# Patient Record
Sex: Female | Born: 1950 | Race: White | Hispanic: No | Smoking: Never smoker
Health system: Southern US, Community
[De-identification: ages and names within clinical notes are randomized; demographics above are authoritative.]

## PROBLEM LIST (undated history)

## (undated) DIAGNOSIS — I10 Essential (primary) hypertension: Secondary | ICD-10-CM

## (undated) DIAGNOSIS — I493 Ventricular premature depolarization: Secondary | ICD-10-CM

## (undated) DIAGNOSIS — I491 Atrial premature depolarization: Principal | ICD-10-CM

## (undated) DIAGNOSIS — R000 Tachycardia, unspecified: Secondary | ICD-10-CM

## (undated) DIAGNOSIS — I471 Supraventricular tachycardia: Secondary | ICD-10-CM

## (undated) HISTORY — DX: Supraventricular tachycardia: I47.1

## (undated) HISTORY — DX: Essential (primary) hypertension: I10

## (undated) HISTORY — DX: Atrial premature depolarization: I49.1

## (undated) HISTORY — PX: BREAST BIOPSY: SHX20

## (undated) HISTORY — DX: Ventricular premature depolarization: I49.3

---

## 1998-05-04 ENCOUNTER — Other Ambulatory Visit: Admission: RE | Admit: 1998-05-04 | Discharge: 1998-05-04 | Payer: Self-pay | Admitting: Obstetrics and Gynecology

## 1998-10-24 ENCOUNTER — Emergency Department (HOSPITAL_COMMUNITY): Admission: EM | Admit: 1998-10-24 | Discharge: 1998-10-25 | Payer: Self-pay | Admitting: Emergency Medicine

## 2001-02-16 ENCOUNTER — Ambulatory Visit (HOSPITAL_COMMUNITY): Admission: RE | Admit: 2001-02-16 | Discharge: 2001-02-16 | Payer: Self-pay | Admitting: Gastroenterology

## 2004-12-07 ENCOUNTER — Encounter: Admission: RE | Admit: 2004-12-07 | Discharge: 2005-03-07 | Payer: Self-pay | Admitting: Orthopedic Surgery

## 2005-03-17 ENCOUNTER — Emergency Department (HOSPITAL_COMMUNITY): Admission: EM | Admit: 2005-03-17 | Discharge: 2005-03-17 | Payer: Self-pay | Admitting: Emergency Medicine

## 2006-03-14 ENCOUNTER — Other Ambulatory Visit: Admission: RE | Admit: 2006-03-14 | Discharge: 2006-03-14 | Payer: Self-pay | Admitting: Family Medicine

## 2007-10-08 ENCOUNTER — Other Ambulatory Visit: Admission: RE | Admit: 2007-10-08 | Discharge: 2007-10-08 | Payer: Self-pay | Admitting: Family Medicine

## 2010-02-05 ENCOUNTER — Other Ambulatory Visit: Admission: RE | Admit: 2010-02-05 | Discharge: 2010-02-05 | Payer: Self-pay | Admitting: Family Medicine

## 2010-05-21 ENCOUNTER — Encounter: Payer: Self-pay | Admitting: Family Medicine

## 2010-09-14 NOTE — Procedures (Signed)
[place]. [facility]  Patient:    Kelsey Wagner, Kelsey Wagner Visit Number: [id number] MRN: [id number]          Service Type: END Location: ENDO Attending Physician:  Rich Brave Dictated by:   Florencia Reasons, M.D. Proc. Date: 02/16/01 Admit Date:  02/16/2001   CC:         Dyanne Carrel, M.D.                           Procedure Report  PROCEDURE PERFORMED:  Colonoscopy.  ENDOSCOPIST:  Florencia Reasons, M.D.  INDICATIONS FOR PROCEDURE:  Screening for colon cancer in an asymptomatic 60 year old female (does have sporadic urgency with defecation consistent with irritable bowel syndrome, and also a history of colon cancer in some distant relatives and colon polyps in her mother).  FINDINGS:  Normal exam to the cecum.  DESCRIPTION OF PROCEDURE:  The nature, purpose and risks of the procedure had been discussed with the patient, who provided written consent.  Sedation was fentanyl 70 mcg and Versed 7 mg IV without arrhythmias or desaturation.  The Olympus colonoscope was advanced to the cecum as identified by visualization of the appendiceal orifice and pull-back was then performed.  The quality of the prep was excellent and it is felt that all areas were well seen.  This was a normal examination.  No polyps, cancer, colitis, vascular malformations or diverticular disease were observed.  Retroflexion in the rectum showed hypertrophied anal papilla but no other abnormalities.  No biopsies were obtained.  The patient tolerated the procedure well and there were no apparent complications.  IMPRESSION:  Normal colonoscopy.  PLAN:  Consider follow-up screening exam in five years. Dictated by:   Florencia Reasons, M.D. Attending Physician:  Rich Brave DD:  02/16/01 TD:  02/17/01 Job: [id number]

## 2010-11-05 ENCOUNTER — Other Ambulatory Visit: Payer: Self-pay | Admitting: Otolaryngology

## 2010-12-24 ENCOUNTER — Other Ambulatory Visit: Payer: Self-pay | Admitting: Gastroenterology

## 2011-02-07 ENCOUNTER — Other Ambulatory Visit: Payer: Self-pay | Admitting: Family Medicine

## 2011-02-14 ENCOUNTER — Ambulatory Visit
Admission: RE | Admit: 2011-02-14 | Discharge: 2011-02-14 | Disposition: A | Discharge status: Home / Self Care | Payer: BC Managed Care – PPO | Source: Ambulatory Visit | Attending: Family Medicine | Admitting: Family Medicine

## 2011-02-14 MED ORDER — IOHEXOL 300 MG/ML  SOLN
100.0000 mL | Freq: Once | INTRAMUSCULAR | Status: AC | PRN
  Administered 2011-02-14: 100 mL via INTRAVENOUS

## 2011-03-28 ENCOUNTER — Other Ambulatory Visit: Payer: Self-pay | Admitting: Family Medicine

## 2011-03-28 DIAGNOSIS — Z1231 Encounter for screening mammogram for malignant neoplasm of breast: Secondary | ICD-10-CM

## 2011-05-02 ENCOUNTER — Ambulatory Visit
Admission: RE | Admit: 2011-05-02 | Discharge: 2011-05-02 | Disposition: A | Discharge status: Home / Self Care | Payer: BC Managed Care – PPO | Source: Ambulatory Visit | Attending: Family Medicine | Admitting: Family Medicine

## 2011-05-02 DIAGNOSIS — Z1231 Encounter for screening mammogram for malignant neoplasm of breast: Secondary | ICD-10-CM

## 2012-06-30 ENCOUNTER — Other Ambulatory Visit: Payer: Self-pay

## 2012-06-30 DIAGNOSIS — Z1231 Encounter for screening mammogram for malignant neoplasm of breast: Secondary | ICD-10-CM

## 2012-07-27 ENCOUNTER — Ambulatory Visit
Admission: RE | Admit: 2012-07-27 | Discharge: 2012-07-27 | Disposition: A | Discharge status: Home / Self Care | Payer: Self-pay | Source: Ambulatory Visit

## 2012-07-27 DIAGNOSIS — Z1231 Encounter for screening mammogram for malignant neoplasm of breast: Secondary | ICD-10-CM

## 2012-09-22 ENCOUNTER — Emergency Department (HOSPITAL_COMMUNITY)
Admission: EM | Admit: 2012-09-22 | Discharge: 2012-09-23 | Disposition: A | Discharge status: Home / Self Care | Payer: BC Managed Care – PPO | Attending: Emergency Medicine | Admitting: Emergency Medicine

## 2012-09-22 ENCOUNTER — Emergency Department (HOSPITAL_COMMUNITY): Payer: BC Managed Care – PPO

## 2012-09-22 ENCOUNTER — Encounter (HOSPITAL_COMMUNITY): Payer: Self-pay

## 2012-09-22 DIAGNOSIS — Y9389 Activity, other specified: Secondary | ICD-10-CM | POA: Insufficient documentation

## 2012-09-22 DIAGNOSIS — S9306XA Dislocation of unspecified ankle joint, initial encounter: Secondary | ICD-10-CM | POA: Insufficient documentation

## 2012-09-22 DIAGNOSIS — S82843A Displaced bimalleolar fracture of unspecified lower leg, initial encounter for closed fracture: Secondary | ICD-10-CM | POA: Insufficient documentation

## 2012-09-22 DIAGNOSIS — S9305XA Dislocation of left ankle joint, initial encounter: Secondary | ICD-10-CM

## 2012-09-22 DIAGNOSIS — S82842A Displaced bimalleolar fracture of left lower leg, initial encounter for closed fracture: Secondary | ICD-10-CM

## 2012-09-22 DIAGNOSIS — Z8679 Personal history of other diseases of the circulatory system: Secondary | ICD-10-CM | POA: Insufficient documentation

## 2012-09-22 DIAGNOSIS — Z79899 Other long term (current) drug therapy: Secondary | ICD-10-CM | POA: Insufficient documentation

## 2012-09-22 DIAGNOSIS — W010XXA Fall on same level from slipping, tripping and stumbling without subsequent striking against object, initial encounter: Secondary | ICD-10-CM | POA: Insufficient documentation

## 2012-09-22 DIAGNOSIS — I10 Essential (primary) hypertension: Secondary | ICD-10-CM | POA: Insufficient documentation

## 2012-09-22 DIAGNOSIS — Y92009 Unspecified place in unspecified non-institutional (private) residence as the place of occurrence of the external cause: Secondary | ICD-10-CM | POA: Insufficient documentation

## 2012-09-22 DIAGNOSIS — Z880 Allergy status to penicillin: Secondary | ICD-10-CM | POA: Insufficient documentation

## 2012-09-22 HISTORY — DX: Tachycardia, unspecified: R00.0

## 2012-09-22 HISTORY — DX: Essential (primary) hypertension: I10

## 2012-09-22 MED ORDER — ONDANSETRON 4 MG PO TBDP
4.0000 mg | ORAL_TABLET | Freq: Once | ORAL | Status: AC
  Administered 2012-09-22: 4 mg via ORAL
  Filled 2012-09-22: qty 1

## 2012-09-22 MED ORDER — HYDROCODONE-ACETAMINOPHEN 5-325 MG PO TABS: 1.0000 | ORAL_TABLET | Freq: Four times a day (QID) | ORAL | Status: DC | PRN

## 2012-09-22 MED ORDER — LIDOCAINE HCL 1 % IJ SOLN
INTRAMUSCULAR | Status: AC
  Administered 2012-09-22: 4 mL
  Filled 2012-09-22: qty 20

## 2012-09-22 MED ORDER — MORPHINE SULFATE 4 MG/ML IJ SOLN
8.0000 mg | Freq: Once | INTRAMUSCULAR | Status: DC
  Filled 2012-09-22: qty 2

## 2012-09-22 MED ORDER — CYCLOBENZAPRINE HCL 5 MG PO TABS: 5.0000 mg | ORAL_TABLET | Freq: Three times a day (TID) | ORAL | Status: DC | PRN

## 2012-09-22 NOTE — ED Notes (Signed)
Ortho tech Jonny Ruiz and Ortho MD Dr. Shon Baton at bedside.

## 2012-09-22 NOTE — ED Notes (Signed)
Per EMS, pt was walking up hill and slipped falling.  Pt has left leg/ankle injury with obvious deformity.  Hx HTN, V Tach episode.  Vitals:141/89, hr 83, resp 16, 95%  Pain 2/10.  Leg immobilized in route.  IV 20 g LAC.  Was given fentanyl in route.  Pt also on LSB board.

## 2012-09-22 NOTE — ED Notes (Signed)
YQM:[id number]<ON> Expected date:<BR> Expected time:<BR> Means of arrival:<BR> Comments:<BR> EMS, 60 F, Left Leg Deformity

## 2012-09-22 NOTE — ED Provider Notes (Signed)
History     CSN: [id number]  Arrival date & time 09/22/12  2233   First MD Initiated Contact with Patient 09/22/12 2243      Chief Complaint  Patient presents with  . Leg Pain  . Fall    (Consider location/radiation/quality/duration/timing/severity/associated sxs/prior treatment) HPI Kelsey Wagner is a 62 y.o. female to ER status post mechanical fall described as a slipping on dirt.  Patient states that she is unable to ambulate on left ankle since incident and presents with obvious deformity.  Patient is good friends with Dr. Shon Baton, orthopedic surgeon who has been called and is needing patient in the emergency department.  Patient has no other complaints at this time.  She denies alcohol use.  Past Medical History  Diagnosis Date  . Hypertension   . Tachycardia     History reviewed. No pertinent past surgical history.  History reviewed. No pertinent family history.  History  Substance Use Topics  . Smoking status: Never Smoker   . Smokeless tobacco: Not on file  . Alcohol Use: Yes     Comment: social    OB History   Grav Para Term Preterm Abortions TAB SAB Ect Mult Living                  Review of Systems  All other systems reviewed and are negative.    Allergies  Penicillins  Home Medications   Current Outpatient Rx  Name  Route  Sig  Dispense  Refill  . ALPRAZolam (XANAX) 0.5 MG tablet   Oral   Take 0.5 mg by mouth 3 (three) times daily as needed for sleep. For anxiety         . hydrochlorothiazide (HYDRODIURIL) 25 MG tablet   Oral   Take 25 mg by mouth daily.         . Multiple Vitamin (MULTIVITAMIN WITH MINERALS) TABS   Oral   Take 1 tablet by mouth daily.         . quinapril (ACCUPRIL) 10 MG tablet   Oral   Take 10 mg by mouth daily.           BP 156/91  Pulse 87  Temp(Src) 98.4 F (36.9 C) (Oral)  Resp 18  SpO2 97%  Physical Exam  Nursing note and vitals reviewed. Constitutional: She appears well-developed and  well-nourished. No distress.  HENT:  Head: Normocephalic and atraumatic.  Eyes: Conjunctivae and EOM are normal.  Neck: Normal range of motion. Neck supple.  Cardiovascular:  Intact distal pulses, capillary refill < 3 seconds  Musculoskeletal:  Obvious dislocation of left ankle.  Tenderness at medial and lateral malleolus.  All other extremities with normal ROM  Neurological:  No sensory deficit  Skin: She is not diaphoretic.  Skin intact    ED Course  Procedures (including critical care time)  Labs Reviewed - No data to display No results found.   1. Bimalleolar fracture, left, closed, initial encounter   2. Dislocated ankle, left, initial encounter       MDM  Patient to ER with left ankle dislocation.  Neurovascularly intact on exam. Orthopedic Dr. Shon Baton has relocated left ankle and reviewed post relocation x-ray and diagnosed patient with bimalleolar fracture.  Per his instructions patient has been discharged with cast, crutches, muscle relaxers and pain medication.  Patient is to followup as outpatient for surgery.        Jaci Carrel, [place] 09/22/12 2338

## 2012-09-22 NOTE — Consult Note (Signed)
Full note dictated Report No. [id number]

## 2012-09-23 NOTE — H&P (Signed)
NAME:  Kelsey, [name]               ACCOUNT NO.:  [id number]  MEDICAL RECORD NO.:  [id number]  LOCATION:  WA14                         FACILITY:  [facility]  PHYSICIAN:  Alvy Beal, MD    DATE OF BIRTH:  1950/11/29  DATE OF ADMISSION:  09/22/2012 DATE OF DISCHARGE:  09/23/2012                             HISTORY & PHYSICAL   REASON FOR CONSULTATION:  Status post fall with left ankle fracture.  HISTORY OF PRESENT ILLNESS:  This is a very pleasant 62 year old woman who was at home today.  Earlier on tonight, lost her footing and fell down a hill and felt a snap in her left ankle.  There was an obvious deformity and she could not walk.  Ultimately, she was brought to the emergency room by EMS.  I evaluated the patient in the ER.  Past medical, surgical, family, social history is essentially unremarkable.  Please refer to the ER note for specifics.  No previous surgery except for an episiotomy at the birth of one of her children.  She had a history of V-tach, which has been treated by her PCP.  PHYSICAL EXAMINATION:  GENERAL:  She is a pleasant woman who appears her stated age in no acute distress.  She is alert.  She is oriented x3. EXTREMITIES:  There is obvious gross deformity of the left ankle and tenting of the skin over the medial malleolus.  There is no abrasion or laceration.  Dorsalis pedis, posterior tibialis pulses are both intact. Sensation to light touch over the mid and forefoot is intact. Compartments are soft and nontender.  No knee pain.  No hip pain.  Right side is asymptomatic.  No shortness of breath or chest pain. ABDOMEN:  Is soft, nontender.  Because of the tenting of the skin, I did a hematoma block under sterile conditions, with plain lidocaine, and then reduced with a gentle reduction maneuver, reduced the ankle into a more anatomic position.  A posterior splint with side struts was then applied.  Postreduction, the patient remained neurovascularly intact.   Pulses were intact. Compartments remained soft and nontender and she was much more comfortable.  She did not require any IV narcotics.  At this point, postreduction films are now pending.  PLAN:  I will discuss definitive management with my partner Dr. Victorino Dike. If the patient wishes to be discharged home once she has been observed and she is comfortable, and she is able to ambulate nonweightbearing with crutches.  If she would like to based on social reasons, she can go home.  If there are going to be problems because of inability to ambulate, nonweightbearing, then I may admit and then discuss with Dr. Victorino Dike, definitive management.  The patient and her daughter are aware of the plan and they are in agreement.     Alvy Beal, MD     DDB/MEDQ  D:  09/22/2012  T:  09/23/2012  Job:  [id number]  cc:   [facility]

## 2012-09-24 NOTE — ED Provider Notes (Signed)
Medical screening examination/treatment/procedure(s) were performed by non-physician practitioner and as supervising physician I was immediately available for consultation/collaboration.   Loren Racer, MD 09/24/12 [phone]

## 2012-10-01 ENCOUNTER — Ambulatory Visit (HOSPITAL_BASED_OUTPATIENT_CLINIC_OR_DEPARTMENT_OTHER): Admit: 2012-10-01 | Payer: Self-pay | Admitting: Orthopedic Surgery

## 2012-10-01 ENCOUNTER — Encounter (HOSPITAL_BASED_OUTPATIENT_CLINIC_OR_DEPARTMENT_OTHER): Payer: Self-pay

## 2012-10-01 SURGERY — OPEN REDUCTION INTERNAL FIXATION (ORIF) ANKLE FRACTURE
Anesthesia: General | Site: Ankle | Laterality: Left

## 2014-01-12 ENCOUNTER — Other Ambulatory Visit: Payer: Self-pay

## 2014-01-12 DIAGNOSIS — Z1231 Encounter for screening mammogram for malignant neoplasm of breast: Secondary | ICD-10-CM

## 2014-02-04 ENCOUNTER — Ambulatory Visit: Payer: BC Managed Care – PPO

## 2014-04-28 ENCOUNTER — Ambulatory Visit: Payer: BC Managed Care – PPO

## 2014-04-28 ENCOUNTER — Ambulatory Visit
Admission: RE | Admit: 2014-04-28 | Discharge: 2014-04-28 | Disposition: A | Discharge status: Home / Self Care | Payer: Medicare Other | Source: Ambulatory Visit

## 2014-04-28 DIAGNOSIS — Z1231 Encounter for screening mammogram for malignant neoplasm of breast: Secondary | ICD-10-CM

## 2014-05-26 ENCOUNTER — Other Ambulatory Visit (HOSPITAL_COMMUNITY)
Admission: RE | Admit: 2014-05-26 | Discharge: 2014-05-26 | Disposition: A | Discharge status: Home / Self Care | Payer: Medicare Other | Source: Ambulatory Visit | Attending: Family Medicine | Admitting: Family Medicine

## 2014-05-26 ENCOUNTER — Other Ambulatory Visit: Payer: Self-pay | Admitting: Physician Assistant

## 2014-05-26 DIAGNOSIS — Z124 Encounter for screening for malignant neoplasm of cervix: Secondary | ICD-10-CM | POA: Diagnosis present

## 2014-05-30 LAB — CYTOLOGY - PAP

## 2015-06-29 ENCOUNTER — Other Ambulatory Visit: Payer: Self-pay | Admitting: Cardiovascular Disease

## 2015-06-29 ENCOUNTER — Encounter: Payer: Self-pay | Admitting: Cardiovascular Disease

## 2015-06-29 ENCOUNTER — Ambulatory Visit (INDEPENDENT_AMBULATORY_CARE_PROVIDER_SITE_OTHER): Payer: Medicare Other | Admitting: Cardiovascular Disease

## 2015-06-29 VITALS — BP 136/90 | HR 76 | Ht 68.0 in | Wt 192.2 lb

## 2015-06-29 DIAGNOSIS — I471 Supraventricular tachycardia, unspecified: Secondary | ICD-10-CM

## 2015-06-29 DIAGNOSIS — R002 Palpitations: Secondary | ICD-10-CM | POA: Diagnosis not present

## 2015-06-29 DIAGNOSIS — Z8679 Personal history of other diseases of the circulatory system: Secondary | ICD-10-CM | POA: Insufficient documentation

## 2015-06-29 DIAGNOSIS — I10 Essential (primary) hypertension: Secondary | ICD-10-CM

## 2015-06-29 DIAGNOSIS — E669 Obesity, unspecified: Secondary | ICD-10-CM | POA: Diagnosis not present

## 2015-06-29 HISTORY — DX: Essential (primary) hypertension: I10

## 2015-06-29 HISTORY — DX: Supraventricular tachycardia: I47.1

## 2015-06-29 HISTORY — DX: Supraventricular tachycardia, unspecified: I47.10

## 2015-06-29 LAB — CBC
HCT: 42.1 % (ref 36.0–46.0)
Hemoglobin: 14.2 g/dL (ref 12.0–15.0)
MCH: 28.7 pg (ref 26.0–34.0)
MCHC: 33.7 g/dL (ref 30.0–36.0)
MCV: 85.2 fL (ref 78.0–100.0)
MPV: 10.8 fL (ref 8.6–12.4)
PLATELETS: 300 10*3/uL (ref 150–400)
RBC: 4.94 MIL/uL (ref 3.87–5.11)
RDW: 13.8 % (ref 11.5–15.5)
WBC: 6.2 10*3/uL (ref 4.0–10.5)

## 2015-06-29 NOTE — Progress Notes (Signed)
Cardiology Office Note   Date:  06/29/2015   ID:  Kelsey Wagner, DOB May 20, 1950, MRN [id number]  PCP:  No PCP Per Patient  Cardiologist:   Kelsey Wagner   Chief Complaint  Patient presents with  . New Patient (Initial Visit)    no chest pain, no shortness of breath, no edema, no pain or cramping in legs,no lightheadedness or dizziness, no fatigue      History of Present Illness: Kelsey Wagner is a 65 y.o. female with hypertension and SVT who presents for follow up.  Kelsey Wagner was diagnosed with SVT 15 years ago.  She saw Dr. Graciela Wagner and has been able to keep it under control by avoiding caffeine and stress.  She was on metoprolol many years ago but stopped it due to fatigue.   One month ago she had an episode that lasted for two hours.  Her heart rate was between 175-180.  She felt short of breath and as though she had an adrenaline rush that would not stop.  She was unable to get it to stop with carotid massage or with elevating her legs.  Since then she had noted several episodes of less severe palpitations.  She thinks that the recurrence of her symptoms is due to stress.  She has been the primary caregiver for her mother who has been quite ill.  Kelsey Wagner recently started to increase her exercise by walking.  She walks for 2 miles, 2-3 times per week.  She denies chest pain or shortness of breath with this activity.  She denies lower extremity edema, orthopnea or PND.  Kelsey Wagner has not yet taken her blood pressure medication today.  Past Medical History  Diagnosis Date  . SVT (supraventricular tachycardia) (HCC) 06/29/2015  . Essential hypertension 06/29/2015    No past surgical history on file.   Current Outpatient Prescriptions  Medication Sig Dispense Refill  . hydrochlorothiazide (HYDRODIURIL) 25 MG tablet Take 25 mg by mouth daily.    . quinapril (ACCUPRIL) 10 MG tablet Take 10 mg by mouth daily.     No current facility-administered medications for this visit.      Allergies:   Penicillins    Social History:  The patient  reports that she has never smoked. She has never used smokeless tobacco.   Family History:  The patient's family history is not on file.    ROS:  Please see the history of present illness.   Otherwise, review of systems are positive for none.   All other systems are reviewed and negative.    PHYSICAL EXAM: VS:  BP 136/90 mmHg  Pulse 76  Ht  (1.727 m)  Wt 87.176 kg (192 lb 3 oz)  BMI 29.23 kg/m2 , BMI Body mass index is 29.23 kg/(m^2). GENERAL:  Well appearing HEENT:  Pupils equal round and reactive, fundi not visualized, oral mucosa unremarkable NECK:  No jugular venous distention, waveform within normal limits, carotid upstroke brisk and symmetric, no bruits, no thyromegaly LYMPHATICS:  No cervical adenopathy LUNGS:  Clear to auscultation bilaterally HEART:  RRR.  PMI not displaced or sustained,S1 and S2 within normal limits, no S3, no S4, no clicks, no rubs, no murmurs ABD:  Flat, positive bowel sounds normal in frequency in pitch, no bruits, no rebound, no guarding, no midline pulsatile mass, no hepatomegaly, no splenomegaly EXT:  2 plus pulses throughout, no edema, no cyanosis no clubbing SKIN:  No rashes no nodules NEURO:  Cranial nerves  II through XII grossly intact, motor grossly intact throughout [facility]:  Cognitively intact, oriented to person place and time    EKG:  EKG is ordered today. The ekg ordered today demonstrates sinus rhythm.  Rate 76 bpm.   Recent Labs: No results found for requested labs within last 365 days.    Lipid Panel No results found for: CHOL, TRIG, HDL, CHOLHDL, VLDL, LDLCALC, LDLDIRECT    Wt Readings from Last 3 Encounters:  06/29/15 87.176 kg (192 lb 3 oz)      ASSESSMENT AND PLAN:  # SVT: It is most likely that Kelsey Wagner is having SVT again.  However, given that it has been 15 years, we will obtain a 48 hour Holter to ensure that she is not having atrial fibrillation  or any other arrhythmias that would require alternative management.  If she does have atrial fibrillation, we will try a calcium channel blocker, as she had fatigue with metoprolol.  We briefly discussed ablation and she will consider this.  # Hypertension: BP is poorly-controlled.  However, she forgot to take her medication this AM.  Continue HCTZ and quinipril.  # CV Disease Prevention: Kelsey Wagner was encouraged to increase her physical activity to most days of the week.  We will check her lipids today.  Increase exercise lip  Current medicines are reviewed at length with the patient today.  The patient does not have concerns regarding medicines.  The following changes have been made:  no change  Labs/ tests ordered today include:   Orders Placed This Encounter  Procedures  . Basic metabolic panel  . T4, free  . TSH  . Lipid panel  . CBC  . Holter monitor - 48 hour  . EKG 12-Lead     Disposition:   FU with Kelsey Brunelle C. Kelsey Salvia, Wagner, [facility] in 2 weeks.    This note was written with the assistance of speech recognition software.  Please excuse any transcriptional errors.  Signed, Kelsey Burget C. Kelsey Salvia, Wagner, [facility]  06/29/2015 6:24 PM    [facility]

## 2015-06-29 NOTE — Patient Instructions (Signed)
Your physician recommends that you return for lab work in: FASTING AT [facility]  Your physician has recommended that you wear a 48 HOUR holter monitor. Holter monitors are medical devices that record the heart's electrical activity. Doctors most often use these monitors to diagnose arrhythmias. Arrhythmias are problems with the speed or rhythm of the heartbeat. The monitor is a small, portable device. You can wear one while you do your normal daily activities. This is usually used to diagnose what is causing palpitations/syncope (passing out).  Your physician recommends that you schedule a follow-up appointment in: 2 WEEKS WITH DR. Bennington

## 2015-06-30 LAB — LIPID PANEL
CHOL/HDL RATIO: 3 ratio (ref <=5.0)
Cholesterol: 160 mg/dL (ref 125–200)
HDL: 53 mg/dL (ref >=46)
LDL CALC: 92 mg/dL (ref <130)
Triglycerides: 73 mg/dL (ref <150)
VLDL: 15 mg/dL (ref <30)

## 2015-06-30 LAB — TSH: TSH: 1.05 m[IU]/L

## 2015-06-30 LAB — BASIC METABOLIC PANEL
BUN: 14 mg/dL (ref 7–25)
CHLORIDE: 102 mmol/L (ref 98–110)
CO2: 29 mmol/L (ref 20–31)
CREATININE: 0.65 mg/dL (ref 0.50–0.99)
Calcium: 9.6 mg/dL (ref 8.6–10.4)
Glucose, Bld: 81 mg/dL (ref 65–99)
POTASSIUM: 4.9 mmol/L (ref 3.5–5.3)
Sodium: 141 mmol/L (ref 135–146)

## 2015-06-30 LAB — T4, FREE: FREE T4: 1.4 ng/dL (ref 0.8–1.8)

## 2015-07-05 ENCOUNTER — Ambulatory Visit (INDEPENDENT_AMBULATORY_CARE_PROVIDER_SITE_OTHER): Payer: Medicare Other

## 2015-07-05 DIAGNOSIS — I471 Supraventricular tachycardia: Secondary | ICD-10-CM | POA: Diagnosis not present

## 2015-07-05 DIAGNOSIS — R002 Palpitations: Secondary | ICD-10-CM | POA: Diagnosis not present

## 2015-07-13 ENCOUNTER — Ambulatory Visit (INDEPENDENT_AMBULATORY_CARE_PROVIDER_SITE_OTHER): Payer: Medicare Other | Admitting: Cardiovascular Disease

## 2015-07-13 ENCOUNTER — Encounter: Payer: Self-pay | Admitting: Cardiovascular Disease

## 2015-07-13 VITALS — BP 104/80 | HR 100 | Ht 67.5 in | Wt 187.6 lb

## 2015-07-13 DIAGNOSIS — I493 Ventricular premature depolarization: Secondary | ICD-10-CM | POA: Diagnosis not present

## 2015-07-13 DIAGNOSIS — I491 Atrial premature depolarization: Secondary | ICD-10-CM | POA: Diagnosis not present

## 2015-07-13 DIAGNOSIS — I10 Essential (primary) hypertension: Secondary | ICD-10-CM

## 2015-07-13 HISTORY — DX: Atrial premature depolarization: I49.1

## 2015-07-13 HISTORY — DX: Ventricular premature depolarization: I49.3

## 2015-07-13 NOTE — Patient Instructions (Addendum)
Medication Instructions:  STOP YOUR QUINAPRIL   START BYSTOLIC 5 MG 1/2 TABLET DAILY   Labwork: NONE  Testing/Procedures: NONE  Follow-Up: Your physician recommends that you schedule a follow-up appointment in: 2 WEEKS  If you need a refill on your cardiac medications before your next appointment, please call your pharmacy.

## 2015-07-13 NOTE — Progress Notes (Signed)
Cardiology Office Note   Date:  07/13/2015   ID:  Kelsey Wagner, DOB 06/11/1950, MRN [id number]  PCP:  No PCP Per Patient  Cardiologist:   Madilyn Hookandolph, Helga Asbury P, MD   Chief Complaint  Patient presents with  . Follow-up    no chest pain, no shortness of breath, no edema, no pain or cramping in legs, no lightheadedness or dizziness, no fatigue    Patient ID: Kelsey Wagner is a 65 y.o. female with hypertension and SVT who presents for follow up.    Interval History 07/13/15: After her last appointment Kelsey Wagner had a 48 hour Holter monitor that revealed very frequent PVCs (5.3%) and frequent PACs.  She did not have any sustained arrhythmias and did not know any symptoms while wearing it. She had some short palpitations but nothing like the symptoms that initially brought her to our attention. She has been increasing her walking and has lost 5 pounds since I last saw her. She is also changed her diet and has eliminated carbohydrates from her diet. She is feeling much better and has increased energy levels. Kelsey Wagner denies lower extremity edema but does use her diuretic because she gets some swelling in her hands.   History of Present Illness 06/29/15:Kelsey Wagner was diagnosed with SVT 15 years ago.  She saw Dr. Graciela HusbandsKlein and has been able to keep it under control by avoiding caffeine and stress.  She was on metoprolol many years ago but stopped it due to fatigue.   One month ago she had an episode that lasted for two hours.  Her heart rate was between 175-180.  She felt short of breath and as though she had an adrenaline rush that would not stop.  She was unable to get it to stop with carotid massage or with elevating her legs.  Since then she had noted several episodes of less severe palpitations.  She thinks that the recurrence of her symptoms is due to stress.  She has been the primary caregiver for her mother who has been quite ill.  Kelsey Wagner recently started to increase her exercise by  walking.  She walks for 2 miles, 2-3 times per week.  She denies chest pain or shortness of breath with this activity.  She denies lower extremity edema, orthopnea or PND.  Kelsey Wagner has not yet taken her blood pressure medication today.  Past Medical History  Diagnosis Date  . SVT (supraventricular tachycardia) (HCC) 06/29/2015  . Essential hypertension 06/29/2015  . PAC (premature atrial contraction) 07/13/2015  . PVC (premature ventricular contraction) 07/13/2015    No past surgical history on file.   Current Outpatient Prescriptions  Medication Sig Dispense Refill  . hydrochlorothiazide (HYDRODIURIL) 25 MG tablet Take 25 mg by mouth daily.    . quinapril (ACCUPRIL) 10 MG tablet Take 10 mg by mouth daily.     No current facility-administered medications for this visit.    Allergies:   Penicillins    Social History:  The patient  reports that she has never smoked. She has never used smokeless tobacco.   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.  Family History:  The patient's family history is not on file.    ROS:  Please see the history of present illness.   Otherwise, review of systems are positive for none.   All other systems are reviewed and negative.    PHYSICAL EXAM: VS:  BP 104/80 mmHg  Pulse 100  Ht 5' 7.5" (  1.715 m)  Wt 85.078 kg (187 lb 9 oz)  BMI 28.93 kg/m2 , BMI Body mass index is 28.93 kg/(m^2). GENERAL:  Well appearing HEENT:  Pupils equal round and reactive, fundi not visualized, oral mucosa unremarkable NECK:  No jugular venous distention, waveform within normal limits, carotid upstroke brisk and symmetric, no bruits LYMPHATICS:  No cervical adenopathy LUNGS:  Clear to auscultation bilaterally HEART:  RRR.  PMI not displaced or sustained,S1 and S2 within normal limits, no S3, no S4, no clicks, no rubs, II/VI systolic murmur at the RUSB ABD:  Flat, positive bowel sounds normal in frequency in pitch, no bruits, no rebound, no guarding,  no midline pulsatile mass, no hepatomegaly, no splenomegaly EXT:  2 plus pulses throughout, no edema, no cyanosis no clubbing SKIN:  No rashes no nodules NEURO:  Cranial nerves II through XII grossly intact, motor grossly intact throughout PSYCH:  Cognitively intact, oriented to person place and time  EKG:  EKG is ordered today. The ekg ordered today demonstrates sinus rhythm.  Rate 76 bpm.    48 Hour Holter Monitor 07/05/15:  Quality: Fair. Baseline artifact. Predominant rhythm: sinus rhythm, sinus tachycardia, sinus arrhythmia Average heart rate: 88bpm Max heart rate: 148 bpm Min heart rate: 64 bpm Pauses >2.5 seconds: 0 Ventricular ectopics: 10,105 (799 bigemeny, 16 couplets)  Percentage of ectopic beats: 5.29% Morphology: polymorphic Supraventricular ectopics: 661 Patient did not submit a symptom diary  Recent Labs: 06/29/2015: BUN 14; Creat 0.65; Hemoglobin 14.2; Platelets 300; Potassium 4.9; Sodium 141; TSH 1.05    Lipid Panel    Component Value Date/Time   CHOL 160 06/29/2015 1528   TRIG 73 06/29/2015 1528   HDL 53 06/29/2015 1528   CHOLHDL 3.0 06/29/2015 1528   VLDL 15 06/29/2015 1528   LDLCALC 92 06/29/2015 1528      Wt Readings from Last 3 Encounters:  07/13/15 85.078 kg (187 lb 9 oz)  06/29/15 87.176 kg (192 lb 3 oz)      ASSESSMENT AND PLAN:  # SVT, PACs, PVCs: Kelsey Wagner did not have any sustained arrhythmias but did have very frequent PVCs and PACs.  She did not tolerate a beta blocker in the past due to fatigue. She has not yet taken her hydrochlorothiazide and her blood pressure is on the low side of normal. She is asymptomatic. We w try Bysystolic 2.5 mg daily. Hopefully this will not cause fatigue.   # Hypertension: BP is well-controlled. We will stop quinapril given that her blood pressure is well controlled today and she has not yet had her hydrochlorothiazide. We will start Bystolic 2.5 mg as above.  She will keep a log of her blood pressures and  bring it for follow-up in 2 weeks.   # CV Disease Prevention: Kelsey Wagner has increased her exercise regimen and lost 5 pounds. She is also changed her diet. She was congratulated on these efforts and encouraged to keep them up.   Current medicines are reviewed at length with the patient today.  The patient does not have concerns regarding medicines.  The following changes have been made:  no change  Labs/ tests ordered today include:   No orders of the defined types were placed in this encounter.     Disposition:   FU with Kyle Stansell C. Duke Salvia, MD, [facility] in 2 weeks.    This note was written with the assistance of speech recognition software.  Please excuse any transcriptional errors.  Signed, Cici Rodriges C. Duke Salvia, MD, [facility]  07/13/2015 11:42  AM    [place]

## 2015-07-20 ENCOUNTER — Other Ambulatory Visit: Payer: Self-pay

## 2015-07-20 DIAGNOSIS — Z1231 Encounter for screening mammogram for malignant neoplasm of breast: Secondary | ICD-10-CM

## 2015-07-26 ENCOUNTER — Ambulatory Visit (INDEPENDENT_AMBULATORY_CARE_PROVIDER_SITE_OTHER): Payer: Medicare Other | Admitting: Physician Assistant

## 2015-07-26 VITALS — BP 126/72 | HR 67 | Temp 97.8°F | Resp 20 | Ht 66.54 in | Wt 184.2 lb

## 2015-07-26 DIAGNOSIS — J029 Acute pharyngitis, unspecified: Secondary | ICD-10-CM

## 2015-07-26 LAB — POCT RAPID STREP A (OFFICE): Rapid Strep A Screen: NEGATIVE

## 2015-07-26 MED ORDER — MAGIC MOUTHWASH W/LIDOCAINE: 10.0000 mL | ORAL | Status: DC | PRN

## 2015-07-26 NOTE — Patient Instructions (Addendum)
Pharyngitis Pharyngitis is redness, pain, and swelling (inflammation) of your pharynx.  CAUSES  Pharyngitis is usually caused by infection. Most of the time, these infections are from viruses (viral) and are part of a cold. However, sometimes pharyngitis is caused by bacteria (bacterial). Pharyngitis can also be caused by allergies. Viral pharyngitis may be spread from person to person by coughing, sneezing, and personal items or utensils (cups, forks, spoons, toothbrushes). Bacterial pharyngitis may be spread from person to person by more intimate contact, such as kissing.  SIGNS AND SYMPTOMS  Symptoms of pharyngitis include:   Sore throat.   Tiredness (fatigue).   Low-grade fever.   Headache.  Joint pain and muscle aches.  Skin rashes.  Swollen lymph nodes.  Plaque-like film on throat or tonsils (often seen with bacterial pharyngitis). DIAGNOSIS  Your health care provider will ask you questions about your illness and your symptoms. Your medical history, along with a physical exam, is often all that is needed to diagnose pharyngitis. Sometimes, a rapid strep test is done. Other lab tests may also be done, depending on the suspected cause.  TREATMENT  Viral pharyngitis will usually get better in 3-4 days without the use of medicine. Bacterial pharyngitis is treated with medicines that kill germs (antibiotics).  HOME CARE INSTRUCTIONS   Drink enough water and fluids to keep your urine clear or pale yellow.   Only take over-the-counter or prescription medicines as directed by your health care provider:   If you are prescribed antibiotics, make sure you finish them even if you start to feel better.   Do not take aspirin.   Get lots of rest.   Gargle with 8 oz of salt water ( tsp of salt per 1 qt of water) as often as every 1-2 hours to soothe your throat.   Throat lozenges (if you are not at risk for choking) or sprays may be used to soothe your throat. SEEK MEDICAL  CARE IF:   You have large, tender lumps in your neck.  You have a rash.  You cough up green, yellow-brown, or bloody spit. SEEK IMMEDIATE MEDICAL CARE IF:   Your neck becomes stiff.  You drool or are unable to swallow liquids.  You vomit or are unable to keep medicines or liquids down.  You have severe pain that does not go away with the use of recommended medicines.  You have trouble breathing (not caused by a stuffy nose). MAKE SURE YOU:   Understand these instructions.  Will watch your condition.  Will get help right away if you are not doing well or get worse.   This information is not intended to replace advice given to you by your health care provider. Make sure you discuss any questions you have with your health care provider.   Document Released: 04/15/2005 Document Revised: 02/03/2013 Document Reviewed: 12/21/2012 Elsevier Interactive Patient Education 2016 Elsevier Inc.   IF you received an x-ray today, you will receive an invoice from [facility]. Please contact [facility] at [phone] with questions or concerns regarding your invoice.   IF you received labwork today, you will receive an invoice from [facility]. Please contact [facility] at [phone] with questions or concerns regarding your invoice.   Our billing staff will not be able to assist you with questions regarding bills from these companies.  You will be contacted with the lab results as soon as they are available. The fastest way to get your results is to activate your My Chart   account. Instructions are located on the last page of this paperwork. If you have not heard from us regarding the results in 2 weeks, please contact this office.      

## 2015-07-26 NOTE — Progress Notes (Signed)
Subjective:   Patient ID: Kelsey Wagner, female     DOB: Nov 29, 1950, 65 y.o.    MRN: [id number]  PCP: No PCP Per Patient  Chief Complaint  Patient presents with  . Sore Throat    needs strep test    HPI  Presents for evaluation of one day of throat soreness.   She was with her grandsons this weekend, three days ago, who both tested positive for strep. Symptoms started today when she was at work, throat was sore, she started to feel achy and not well with general body aches, no fever. She endorses trouble swallowing due to pain, but no cervical lymphadenopathy. In the past few hours she has developed a severe headache, been exacerbated since in the waiting room. She is very frustrated about her wait here. Of note, she has had no influenza vaccination this year. She has hx of strep throat infections in the past, all treated with antibiotics, allergic to penicillin. No nausea, vomiting or fever noted. .   Prior to Admission medications   Medication Sig Start Date End Date Taking? Authorizing Provider  hydrochlorothiazide (HYDRODIURIL) 25 MG tablet Take 25 mg by mouth daily.   Yes Historical Provider, MD  nebivolol (BYSTOLIC) 5 MG tablet Take 5 mg by mouth as directed. 1/2 TABLET BY MOUTH DAILY   Yes Historical Provider, MD     Allergies  Allergen Reactions  . Penicillins Hives and Rash     Patient Active Problem List   Diagnosis Date Noted  . PAC (premature atrial contraction) 07/13/2015  . PVC (premature ventricular contraction) 07/13/2015  . SVT (supraventricular tachycardia) (HCC) 06/29/2015  . Essential hypertension 06/29/2015     No family history on file.   Social History   Social History  . Marital Status: Divorced    Spouse Name: N/A  . Number of Children: N/A  . Years of Education: N/A   Occupational History  . Not on file.   Social History Main Topics  . Smoking status: Never Smoker   . Smokeless tobacco: Never Used  . Alcohol Use: Not on file  .  Drug Use: Not on file  . Sexual Activity: Not on file   Other Topics Concern  . Not on file   Social History Narrative        Review of Systems As above.      Objective:  Physical Exam  Constitutional: She is oriented to person, place, and time. She appears well-developed and well-nourished. She is active and cooperative. No distress.  BP 126/72 mmHg  Pulse 67  Temp(Src) 97.8 F (36.6 C) (Oral)  Resp 20  Ht 5' 6.53" (1.69 m)  Wt 184 lb 3.2 oz (83.553 kg)  BMI 29.25 kg/m2  SpO2 97%  HENT:  Head: Normocephalic and atraumatic.  Right Ear: Hearing, tympanic membrane, external ear and ear canal normal.  Left Ear: Hearing, tympanic membrane, external ear and ear canal normal.  Nose: Nose normal.  Mouth/Throat: Uvula is midline and mucous membranes are normal. No oral lesions. No uvula swelling. Posterior oropharyngeal erythema (mild, L>R) present. No oropharyngeal exudate or posterior oropharyngeal edema.  Eyes: Conjunctivae are normal. No scleral icterus.  Neck: Normal range of motion, full passive range of motion without pain and phonation normal. Neck supple. No thyromegaly present.  Cardiovascular: Normal rate, regular rhythm and normal heart sounds.   Pulses:      Radial pulses are 2+ on the right side, and 2+ on the left side.  Pulmonary/Chest: Effort  normal and breath sounds normal.  Lymphadenopathy:       Head (right side): No tonsillar, no preauricular, no posterior auricular and no occipital adenopathy present.       Head (left side): No tonsillar, no preauricular, no posterior auricular and no occipital adenopathy present.    She has no cervical adenopathy.       Right: No supraclavicular adenopathy present.       Left: No supraclavicular adenopathy present.  Neurological: She is alert and oriented to person, place, and time. She has normal strength. No cranial nerve deficit or sensory deficit.  Skin: Skin is warm, dry and intact. No rash noted. No cyanosis or  erythema. Nails show no clubbing.  Psychiatric: She has a normal mood and affect. Her speech is normal and behavior is normal. Judgment and thought content normal. Cognition and memory are normal.             Assessment & Plan:  1. Sore throat Likely viral, but she has been exposed to strep. Supportive care and await TCx. - POCT rapid strep A - Culture, Group A Strep - magic mouthwash w/lidocaine SOLN; Take 10 mLs by mouth every 2 (two) hours as needed for mouth pain.  Dispense: 360 mL; Refill: 0   Fernande Brashelle S. Demarkis Gheen, PA-C Physician Assistant-Certified [facility]

## 2015-07-26 NOTE — Progress Notes (Signed)
Subjective:     Patient ID: Kelsey Wagner, female   DOB: 02/19/1951, 65 y.o.   MRN: [id number]  HPI The patient is a 65 year old women who presents with one day of throat soreness. She was with her grandsons this weekend, three days ago, who both tested positive for strep. Symptoms started today when she was at work, throat was sore, she started to feel achy and not well with general body aches, no fever taken. She endorses trouble swallowing no cervical lymphadenopathy. In the past few hours she has developed a severe headache, been exacerbated since in the waiting room. Of note, she has had no influenza vaccination this year. She has hx of strep throat infections in the past, all treated with antibiotics, allergic to penicillin. No nausea, vomiting or fever noted.    Review of Systems All pertinent ROS as above in HPI    Objective: Filed Vitals:   07/26/15 1812  BP: 126/72  Pulse: 67  Temp: 97.8 F (36.6 C)  Resp: 20      Physical Exam  Constitutional: She appears well-developed and well-nourished.  HENT:  Head: Normocephalic and atraumatic.  Right Ear: External ear normal.  Left Ear: External ear normal.  Nose: Nose normal.  Mildly enlarged tonsils, specifically on the left side. No exudates. Some petechiae noted on the posterior oropharynx.  Neck: Neck supple.  Cardiovascular: Normal rate, regular rhythm, normal heart sounds and intact distal pulses.  Exam reveals no gallop and no friction rub.   No murmur heard. Pulmonary/Chest: Effort normal and breath sounds normal. No respiratory distress. She has no wheezes. She has no rales.  Lymphadenopathy:    She has no cervical adenopathy.   Results for orders placed or performed in visit on 07/26/15  POCT rapid strep A  Result Value Ref Range   Rapid Strep A Screen Negative Negative       Assessment:     The patient is a 65 year old female exposed to strep throat via grandsons, presents with throat soreness. Will perform  rapid strep test in office to check for GAS pharyngitis. Strep negative, culture will be sent out. Encourage patient to drink fluids, rest, hydrate and engage in good hand hygiene to avoid transference of illness to those at home.    Plan:     1. Sore throat - POCT rapid strep A - Culture, Group A Strep - magic mouthwash w/lidocaine SOLN; Take 10 mLs by mouth every 2 (two) hours as needed for mouth pain.  Dispense: 360 mL; Refill: 0

## 2015-07-27 ENCOUNTER — Ambulatory Visit
Admission: RE | Admit: 2015-07-27 | Discharge: 2015-07-27 | Disposition: A | Discharge status: Home / Self Care | Payer: Medicare Other | Source: Ambulatory Visit

## 2015-07-27 DIAGNOSIS — Z1231 Encounter for screening mammogram for malignant neoplasm of breast: Secondary | ICD-10-CM

## 2015-07-29 LAB — CULTURE, GROUP A STREP: ORGANISM ID, BACTERIA: NORMAL

## 2015-07-31 NOTE — Progress Notes (Signed)
Cardiology Office Note   Date:  08/01/2015   ID:  Kelsey Wagner, DOB 09/28/1950, MRN [id number]  PCP:  Kelsey Labella, MD  Cardiologist:   Kelsey Hook, MD   Chief Complaint  Patient presents with  . Hypertension    Patient ID: Kelsey Wagner is a 65 y.o. female with hypertension and SVT, PACs and PVCs  who presents for follow up.    Interval History 08/01/15:  At her last appointment Kelsey Wagner was started on Bystolic for SVT, PACs and PVCs.  She has been feeling well And denies palpitations. She has not noted the fatigue that she previously felt on beta blockers. She also denies any chest pain or shortness of breath. She continues to exercise almost daily. She has not noted any lower extremity edema, orthopnea, or PND.  She brings a log of her blood pressures that showed it is been running between 100s to 130s over 60s to 70s.  Interval History 07/13/15: After her last appointment Kelsey Wagner had a 48 hour Holter monitor that revealed very frequent PVCs (5.3%) and frequent PACs.  She did not have any sustained arrhythmias and did not know any symptoms while wearing it. She had some short palpitations but nothing like the symptoms that initially brought her to our attention. She has been increasing her walking and has lost 5 pounds since I last saw her. She is also changed her diet and has eliminated carbohydrates from her diet. She is feeling much better and has increased energy levels. Ms. Snethen denies lower extremity edema but does use her diuretic because she gets some swelling in her hands.   History of Present Illness 06/29/15:Kelsey Wagner was diagnosed with SVT 15 years ago.  She saw Kelsey Wagner and has been able to keep it under control by avoiding caffeine and stress.  She was on metoprolol many years ago but stopped it due to fatigue.   One month ago she had an episode that lasted for two hours.  Her heart rate was between 175-180.  She felt short of breath and as though she had  an adrenaline rush that would not stop.  She was unable to get it to stop with carotid massage or with elevating her legs.  Since then she had noted several episodes of less severe palpitations.  She thinks that the recurrence of her symptoms is due to stress.  She has been the primary caregiver for her mother who has been quite ill.  Kelsey Wagner recently started to increase her exercise by walking.  She walks for 2 miles, 2-3 times per week.  She denies chest pain or shortness of breath with this activity.  She denies lower extremity edema, orthopnea or PND.  Kelsey Wagner has not yet taken her blood pressure medication today.  Past Medical History  Diagnosis Date  . SVT (supraventricular tachycardia) (HCC) 06/29/2015  . Essential hypertension 06/29/2015  . PAC (premature atrial contraction) 07/13/2015  . PVC (premature ventricular contraction) 07/13/2015    No past surgical history on file.   Current Outpatient Prescriptions  Medication Sig Dispense Refill  . hydrochlorothiazide (HYDRODIURIL) 25 MG tablet Take 25 mg by mouth daily.    . nebivolol (BYSTOLIC) 2.5 MG tablet Take 1 tablet (2.5 mg total) by mouth daily. 90 tablet 3   No current facility-administered medications for this visit.    Allergies:   Penicillins   Social History:  The patient  reports that she has never smoked. She has  never used smokeless tobacco.   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  Family History:  The patient's family history is not on file.    ROS:  Please see the history of present illness.   Otherwise, review of systems are positive for none.   All other systems are reviewed and negative.    PHYSICAL EXAM: VS:  BP 130/90 mmHg  Pulse 68  Ht 5\' 7"  (1.702 m)  Wt 83.915 kg (185 lb)  BMI 28.97 kg/m2 , BMI Body mass index is 28.97 kg/(m^2).  Repeat BP 128/76 GENERAL:  Well appearing HEENT:  Pupils equal round and reactive, fundi not visualized, oral mucosa unremarkable NECK:  No  jugular venous distention, waveform within normal limits, carotid upstroke brisk and symmetric, no bruits LYMPHATICS:  No cervical adenopathy LUNGS:  Clear to auscultation bilaterally HEART:  RRR.  PMI not displaced or sustained,S1 and S2 within normal limits, no S3, no S4, no clicks, no rubs, II/VI systolic murmur at the RUSB ABD:  Flat, positive bowel sounds normal in frequency in pitch, no bruits, no rebound, no guarding, no midline pulsatile mass, no hepatomegaly, no splenomegaly EXT:  2 plus pulses throughout, no edema, no cyanosis no clubbing SKIN:  No rashes no nodules NEURO:  Cranial nerves II through XII grossly intact, motor grossly intact throughout PSYCH:  Cognitively intact, oriented to person place and time  EKG:  EKG is ordered today. The ekg ordered today demonstrates sinus rhythm.  Rate 76 bpm.  48 Hour Holter Monitor 07/05/15:  Quality: Fair. Baseline artifact. Predominant rhythm: sinus rhythm, sinus tachycardia, sinus arrhythmia Average heart rate: 88bpm Max heart rate: 148 bpm Min heart rate: 64 bpm Pauses >2.5 seconds: 0 Ventricular ectopics: 10,105 (799 bigemeny, 16 couplets)  Percentage of ectopic beats: 5.29% Morphology: polymorphic Supraventricular ectopics: 661 Patient did not submit a symptom diary  Recent Labs: 06/29/2015: BUN 14; Creat 0.65; Hemoglobin 14.2; Platelets 300; Potassium 4.9; Sodium 141; TSH 1.05    Lipid Panel    Component Value Date/Time   CHOL 160 06/29/2015 1528   TRIG 73 06/29/2015 1528   HDL 53 06/29/2015 1528   CHOLHDL 3.0 06/29/2015 1528   VLDL 15 06/29/2015 1528   LDLCALC 92 06/29/2015 1528      Wt Readings from Last 3 Encounters:  08/01/15 83.915 kg (185 lb)  07/26/15 83.553 kg (184 lb 3.2 oz)  07/13/15 85.078 kg (187 lb 9 oz)      ASSESSMENT AND PLAN:  # SVT, PACs, PVCs: Kelsey Wagner She is tolerating the medicine well and has not noted any fatigue. has not noted any palpitations on Bystolic.   we will continue at the  current dosing.   # Hypertension: BP is well-controlled on Bystolic.  Qunipril was discontinued so that a beta blocker could be added for management of her arrhythmia. She will continue on Bystolic and hydrochlorothiazide at its her blood pressure is well-controlled today.  # CV Disease Prevention: Kelsey Wagner continues to exercise daily and is improving her diet. Her weight is stable since her visit last week. She was encouraged to continue these lifestyle changes.   Current medicines are reviewed at length with the patient today.  The patient does not have concerns regarding medicines.  The following changes have been made:  no change  Labs/ tests ordered today include:   No orders of the defined types were placed in this encounter.     Disposition:   FU with Nickalas Mccarrick C. Duke Salviaandolph, MD, [facility] in 1  year.   This note was written with the assistance of speech recognition software.  Please excuse any transcriptional errors.  Signed, Aniel Hubble C. Duke Salvia, MD, [facility]  08/01/2015 11:39 AM    [facility]

## 2015-08-01 ENCOUNTER — Ambulatory Visit (INDEPENDENT_AMBULATORY_CARE_PROVIDER_SITE_OTHER): Payer: Medicare Other | Admitting: Cardiovascular Disease

## 2015-08-01 ENCOUNTER — Encounter: Payer: Self-pay | Admitting: Cardiovascular Disease

## 2015-08-01 VITALS — BP 130/90 | HR 68 | Ht 67.0 in | Wt 185.0 lb

## 2015-08-01 DIAGNOSIS — I10 Essential (primary) hypertension: Secondary | ICD-10-CM | POA: Diagnosis not present

## 2015-08-01 DIAGNOSIS — I471 Supraventricular tachycardia: Secondary | ICD-10-CM | POA: Diagnosis not present

## 2015-08-01 MED ORDER — NEBIVOLOL HCL 2.5 MG PO TABS: 2.5000 mg | ORAL_TABLET | Freq: Every day | ORAL | Status: DC

## 2015-08-01 NOTE — Patient Instructions (Signed)
Your physician recommends that you continue on your current medications as directed. Please refer to the Current Medication list given to you today.   Your physician wants you to follow-up in: 12 MONTHS WITH DR [place]. You will receive a reminder letter in the mail two months in advance. If you don't receive a letter, please call our office to schedule the follow-up appointment.

## 2016-04-03 ENCOUNTER — Encounter (HOSPITAL_COMMUNITY): Payer: Self-pay

## 2016-08-13 ENCOUNTER — Other Ambulatory Visit: Payer: Self-pay | Admitting: *Deleted

## 2016-08-13 MED ORDER — NEBIVOLOL HCL 2.5 MG PO TABS: 2.5000 mg | ORAL_TABLET | Freq: Every day | ORAL | 0 refills | Status: DC

## 2016-10-28 ENCOUNTER — Other Ambulatory Visit: Payer: Self-pay | Admitting: Emergency Medicine

## 2016-10-28 DIAGNOSIS — Z1231 Encounter for screening mammogram for malignant neoplasm of breast: Secondary | ICD-10-CM

## 2016-10-31 ENCOUNTER — Ambulatory Visit (INDEPENDENT_AMBULATORY_CARE_PROVIDER_SITE_OTHER): Payer: Medicare Other | Admitting: Cardiology

## 2016-10-31 ENCOUNTER — Encounter: Payer: Self-pay | Admitting: Cardiology

## 2016-10-31 VITALS — BP 120/84 | HR 68 | Ht 67.0 in | Wt 195.2 lb

## 2016-10-31 DIAGNOSIS — Z8679 Personal history of other diseases of the circulatory system: Secondary | ICD-10-CM | POA: Diagnosis not present

## 2016-10-31 DIAGNOSIS — I10 Essential (primary) hypertension: Secondary | ICD-10-CM | POA: Diagnosis not present

## 2016-10-31 MED ORDER — NEBIVOLOL HCL 2.5 MG PO TABS: 2.5000 mg | ORAL_TABLET | Freq: Every day | ORAL | 3 refills | Status: DC

## 2016-10-31 MED ORDER — HYDROCHLOROTHIAZIDE 25 MG PO TABS: 25.0000 mg | ORAL_TABLET | Freq: Every day | ORAL | 3 refills | Status: DC

## 2016-10-31 NOTE — Assessment & Plan Note (Signed)
Controlled.  

## 2016-10-31 NOTE — Progress Notes (Addendum)
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 Her PSVT has been controlled with Bystolic, she has had fatigue with other beta blockers. Her B/P is controlled with HCTZ. She is here today for a one year check up and to get her Bystolic refilled. She denies any medical issues since we saw her last. She avoids caffeine and has not had any significant issues with palpations over the past year.    Current Outpatient Prescriptions  Medication Sig Dispense Refill  . hydrochlorothiazide (HYDRODIURIL) 25 MG tablet Take 25 mg by mouth daily.    . Multiple Vitamin (MULTIVITAMIN WITH MINERALS) TABS Take 1 tablet by mouth daily.    . nebivolol (BYSTOLIC) 2.5 MG tablet Take 1 tablet (2.5 mg total) by mouth daily. Pt due for follow up appt and refills 30 tablet 0   No current facility-administered medications for this visit.     Allergies  Allergen Reactions  . Penicillins     Bruises under skin  . Penicillins Hives and Rash    Past Medical History:  Diagnosis Date  . Essential hypertension 06/29/2015  . Hypertension   . PAC (premature atrial contraction) 07/13/2015  . PVC (premature ventricular contraction) 07/13/2015  . SVT (supraventricular tachycardia) (HCC) 06/29/2015  . Tachycardia     Social History   Social History  . Marital status: Divorced    Spouse name: N/A  . Number of children: N/A  . Years of education: N/A   Occupational History  . Not on file.   Social History Main Topics  . Smoking status: Never Smoker  . Smokeless tobacco: Never Used  . Alcohol use Not on file     Comment: social  . Drug use: Unknown  . Sexual activity: Not on file   Other Topics Concern  . Not on file   Social History Narrative   ** Merged History Encounter **         No family  history on file.   Review of Systems: General: negative for chills, fever, night sweats or weight changes.  Cardiovascular: negative for chest pain, dyspnea on exertion, edema, orthopnea, palpitations, paroxysmal nocturnal dyspnea or shortness of breath Dermatological: negative for rash Respiratory: negative for cough or wheezing Urologic: negative for hematuria Abdominal: negative for nausea, vomiting, diarrhea, bright red blood per rectum, melena, or hematemesis Neurologic: negative for visual changes, syncope, or dizziness All other systems reviewed and are otherwise negative except as noted above.    Blood pressure 120/84, pulse 68, height 5\' 7"  (1.702 m), weight 195 lb 3.2 oz (88.5 kg).  General appearance: alert, cooperative, no distress and mild distress Neck: no carotid bruit and no JVD Lungs: clear to auscultation bilaterally Heart: regular rate and rhythm Extremities: extremities normal, atraumatic, no cyanosis or edema Skin: Skin color, texture, turgor normal. No rashes or lesions Neurologic: Grossly normal  EKG NSR  ASSESSMENT AND PLAN:   History of PSVT (paroxysmal supraventricular tachycardia) Doing well on Bystolic  Essential hypertension Controlled   PLAN  Will ask PCP for recent labs. F/U with Dr Duke Salviaandolph in one year.  Corine ShelterLuke Gionni Freese PA-C 10/31/2016 9:18 AM   Labs reviewed from 08/30/16- CBC was WNL, CMET WNL, LDL was 128, HDL 60. CRP and sed rate WNL Topanga Alvelo PA-C 10/31/2016 10:12 AM

## 2016-10-31 NOTE — Assessment & Plan Note (Signed)
Doing well on Bystolic

## 2016-10-31 NOTE — Patient Instructions (Signed)
Medication Instructions: No changes  Labwork: None  Follow-Up: Your physician recommends that you schedule a follow-up appointment in: one year with Dr. Duke Salviaandolph    If you need a refill on your cardiac medications before your next appointment, please call your pharmacy.

## 2016-11-06 ENCOUNTER — Ambulatory Visit
Admission: RE | Admit: 2016-11-06 | Discharge: 2016-11-06 | Disposition: A | Discharge status: Home / Self Care | Payer: Medicare Other | Source: Ambulatory Visit | Attending: Emergency Medicine | Admitting: Emergency Medicine

## 2016-11-06 DIAGNOSIS — Z1231 Encounter for screening mammogram for malignant neoplasm of breast: Secondary | ICD-10-CM

## 2016-11-07 ENCOUNTER — Other Ambulatory Visit: Payer: Self-pay | Admitting: Emergency Medicine

## 2016-11-07 DIAGNOSIS — R928 Other abnormal and inconclusive findings on diagnostic imaging of breast: Secondary | ICD-10-CM

## 2016-11-15 ENCOUNTER — Other Ambulatory Visit: Payer: Self-pay | Admitting: Family Medicine

## 2016-11-15 DIAGNOSIS — R928 Other abnormal and inconclusive findings on diagnostic imaging of breast: Secondary | ICD-10-CM

## 2016-11-18 ENCOUNTER — Ambulatory Visit
Admission: RE | Admit: 2016-11-18 | Discharge: 2016-11-18 | Disposition: A | Discharge status: Home / Self Care | Payer: Medicare Other | Source: Ambulatory Visit | Attending: Emergency Medicine | Admitting: Emergency Medicine

## 2016-11-18 ENCOUNTER — Other Ambulatory Visit: Payer: Self-pay | Admitting: Family Medicine

## 2016-11-18 DIAGNOSIS — R921 Mammographic calcification found on diagnostic imaging of breast: Secondary | ICD-10-CM

## 2016-11-18 DIAGNOSIS — R928 Other abnormal and inconclusive findings on diagnostic imaging of breast: Secondary | ICD-10-CM

## 2016-11-19 ENCOUNTER — Ambulatory Visit
Admission: RE | Admit: 2016-11-19 | Discharge: 2016-11-19 | Disposition: A | Discharge status: Home / Self Care | Payer: Medicare Other | Source: Ambulatory Visit | Attending: Family Medicine | Admitting: Family Medicine

## 2016-11-19 DIAGNOSIS — R921 Mammographic calcification found on diagnostic imaging of breast: Secondary | ICD-10-CM

## 2017-10-16 ENCOUNTER — Ambulatory Visit: Payer: Medicare Other | Admitting: Podiatry

## 2017-10-17 ENCOUNTER — Other Ambulatory Visit: Payer: Self-pay | Admitting: Cardiology

## 2017-11-04 ENCOUNTER — Other Ambulatory Visit: Payer: Self-pay | Admitting: Emergency Medicine

## 2017-11-04 DIAGNOSIS — Z1231 Encounter for screening mammogram for malignant neoplasm of breast: Secondary | ICD-10-CM

## 2017-11-19 ENCOUNTER — Ambulatory Visit
Admission: RE | Admit: 2017-11-19 | Discharge: 2017-11-19 | Disposition: A | Discharge status: Home / Self Care | Payer: Medicare Other | Source: Ambulatory Visit | Attending: Emergency Medicine | Admitting: Emergency Medicine

## 2017-11-19 DIAGNOSIS — Z1231 Encounter for screening mammogram for malignant neoplasm of breast: Secondary | ICD-10-CM

## 2017-12-02 ENCOUNTER — Other Ambulatory Visit: Payer: Self-pay | Admitting: Family Medicine

## 2017-12-02 DIAGNOSIS — E2839 Other primary ovarian failure: Secondary | ICD-10-CM

## 2018-01-14 ENCOUNTER — Other Ambulatory Visit: Payer: Self-pay | Admitting: Cardiology

## 2018-01-19 ENCOUNTER — Other Ambulatory Visit: Payer: Self-pay

## 2018-01-20 ENCOUNTER — Other Ambulatory Visit: Payer: Self-pay | Admitting: Cardiology

## 2018-04-08 ENCOUNTER — Other Ambulatory Visit: Payer: Self-pay | Admitting: Cardiology

## 2018-04-08 NOTE — Telephone Encounter (Signed)
Rx sent to pharmacy   

## 2018-04-18 ENCOUNTER — Other Ambulatory Visit: Payer: Self-pay | Admitting: Cardiology

## 2018-05-18 ENCOUNTER — Emergency Department (HOSPITAL_COMMUNITY)
Admission: EM | Admit: 2018-05-18 | Discharge: 2018-05-18 | Disposition: A | Discharge status: Home / Self Care | Payer: Medicare Other | Attending: Emergency Medicine | Admitting: Emergency Medicine

## 2018-05-18 ENCOUNTER — Encounter (HOSPITAL_COMMUNITY): Payer: Self-pay | Admitting: *Deleted

## 2018-05-18 ENCOUNTER — Emergency Department (HOSPITAL_COMMUNITY): Payer: Medicare Other

## 2018-05-18 DIAGNOSIS — Z79899 Other long term (current) drug therapy: Secondary | ICD-10-CM | POA: Diagnosis not present

## 2018-05-18 DIAGNOSIS — N3000 Acute cystitis without hematuria: Secondary | ICD-10-CM | POA: Diagnosis not present

## 2018-05-18 DIAGNOSIS — J029 Acute pharyngitis, unspecified: Secondary | ICD-10-CM | POA: Diagnosis present

## 2018-05-18 DIAGNOSIS — I10 Essential (primary) hypertension: Secondary | ICD-10-CM | POA: Insufficient documentation

## 2018-05-18 DIAGNOSIS — J111 Influenza due to unidentified influenza virus with other respiratory manifestations: Secondary | ICD-10-CM

## 2018-05-18 DIAGNOSIS — R000 Tachycardia, unspecified: Secondary | ICD-10-CM | POA: Diagnosis not present

## 2018-05-18 DIAGNOSIS — E860 Dehydration: Secondary | ICD-10-CM | POA: Diagnosis not present

## 2018-05-18 DIAGNOSIS — J101 Influenza due to other identified influenza virus with other respiratory manifestations: Secondary | ICD-10-CM | POA: Diagnosis not present

## 2018-05-18 DIAGNOSIS — R69 Illness, unspecified: Secondary | ICD-10-CM

## 2018-05-18 LAB — URINALYSIS, ROUTINE W REFLEX MICROSCOPIC
Bilirubin Urine: NEGATIVE
Glucose, UA: NEGATIVE mg/dL
Hgb urine dipstick: NEGATIVE
Ketones, ur: NEGATIVE mg/dL
Nitrite: NEGATIVE
Protein, ur: NEGATIVE mg/dL
Specific Gravity, Urine: 1.021 (ref 1.005–1.030)
TRANS EPITHEL UA: 1
pH: 5 (ref 5.0–8.0)

## 2018-05-18 LAB — I-STAT TROPONIN, ED
TROPONIN I, POC: 0 ng/mL (ref 0.00–0.08)
Troponin i, poc: 0.02 ng/mL (ref 0.00–0.08)

## 2018-05-18 LAB — CBC
HCT: 46.6 % — ABNORMAL HIGH (ref 36.0–46.0)
Hemoglobin: 15.1 g/dL — ABNORMAL HIGH (ref 12.0–15.0)
MCH: 28.2 pg (ref 26.0–34.0)
MCHC: 32.4 g/dL (ref 30.0–36.0)
MCV: 87.1 fL (ref 80.0–100.0)
Platelets: 353 10*3/uL (ref 150–400)
RBC: 5.35 MIL/uL — ABNORMAL HIGH (ref 3.87–5.11)
RDW: 12.8 % (ref 11.5–15.5)
WBC: 11.4 10*3/uL — AB (ref 4.0–10.5)
nRBC: 0 % (ref 0.0–0.2)

## 2018-05-18 LAB — BASIC METABOLIC PANEL
Anion gap: 13 (ref 5–15)
BUN: 14 mg/dL (ref 8–23)
CO2: 20 mmol/L — ABNORMAL LOW (ref 22–32)
Calcium: 9.4 mg/dL (ref 8.9–10.3)
Chloride: 105 mmol/L (ref 98–111)
Creatinine, Ser: 0.98 mg/dL (ref 0.44–1.00)
GFR calc Af Amer: >60 mL/min (ref >60)
GFR calc non Af Amer: 59 mL/min — ABNORMAL LOW (ref >60)
Glucose, Bld: 112 mg/dL — ABNORMAL HIGH (ref 70–99)
Potassium: 3.8 mmol/L (ref 3.5–5.1)
Sodium: 138 mmol/L (ref 135–145)

## 2018-05-18 LAB — INFLUENZA PANEL BY PCR (TYPE A & B)
Influenza A By PCR: NEGATIVE
Influenza B By PCR: NEGATIVE

## 2018-05-18 MED ORDER — ACETAMINOPHEN 500 MG PO TABS: 1000.0000 mg | ORAL_TABLET | Freq: Four times a day (QID) | ORAL | 0 refills | Status: AC | PRN

## 2018-05-18 MED ORDER — CEPHALEXIN 500 MG PO CAPS: 1000.0000 mg | ORAL_CAPSULE | Freq: Two times a day (BID) | ORAL | 0 refills | Status: DC

## 2018-05-18 MED ORDER — SODIUM CHLORIDE 0.9 % IV BOLUS (SEPSIS)
1000.0000 mL | Freq: Once | INTRAVENOUS | Status: AC
  Administered 2018-05-18: 1000 mL via INTRAVENOUS

## 2018-05-18 MED ORDER — SODIUM CHLORIDE 0.9% FLUSH
3.0000 mL | Freq: Once | INTRAVENOUS | Status: AC
  Administered 2018-05-18: 3 mL via INTRAVENOUS

## 2018-05-18 MED ORDER — SODIUM CHLORIDE 0.9 % IV SOLN: 1000.0000 mL | INTRAVENOUS | Status: DC

## 2018-05-18 MED ORDER — ONDANSETRON 4 MG PO TBDP: 4.0000 mg | ORAL_TABLET | ORAL | 0 refills | Status: DC | PRN

## 2018-05-18 NOTE — ED Notes (Signed)
Patient verbalizes understanding of discharge instructions. Opportunity for questioning and answers were provided. 

## 2018-05-18 NOTE — ED Triage Notes (Signed)
Pt in c/o generalized fatigue and tachycardia, reports her fitbit has shown a HR of 190 at home, pt does have a history of Vtach, HR 120 upon initial evaluation, denies pain but reports just not feeling well

## 2018-05-18 NOTE — Discharge Instructions (Signed)
1.  Your symptoms are suspicious for influenza although your influenza test is negative in the emergency department.  Take Tylenol for body aches or fever, stay hydrated and rest. 2.  Your urinalysis test positive for infection.  Start Keflex as prescribed.  A urine culture has been ordered.  Have your doctor follow-up on the results. 3.  You had an elevated heart rate which has improved with hydration.  You reported a higher heart rate at home with a history of SVT.  Call your cardiologist for recheck this week.  Return to the emergency department immediately if you have chest pain, racing heart, feel like he will pass out or other concerning symptoms.

## 2018-05-18 NOTE — ED Provider Notes (Signed)
[facility] EMERGENCY DEPARTMENT Provider Note   CSN: [id number] Arrival date & time: 05/18/18  1341     History   Chief Complaint Chief Complaint  Patient presents with  . Fatigue    HPI Kelsey Wagner is a 68 y.o. female.  HPI Patient reports he started feeling "terrible" 3 days ago.  She started getting body aches, sore throat, headache, fatigue.  She reports at that time she felt like she had a hot poker in her throat.  She reports that she went to the urgent care and was tested for strep which was negative.  She reports that she continued to have ongoing cough, achiness nausea and fatigue.  She reports today she is wearing her Fitbit and her heart rate went up to 190.  She reports that she got to feeling sweaty and weak.  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  She reports she typically lies on the floor and puts her feet on the couch and then does carotid massage.  She reports that did start to help and her heart rate came down to 100.  She did talk to her niece who is a Engineer, civil (consulting) and advised she come to the emergency department for assessment.  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  She also reports that she did not get any influenza vaccine this year. Past Medical History:  Diagnosis Date  . Essential hypertension 06/29/2015  . Hypertension   . PAC (premature atrial contraction) 07/13/2015  . PVC (premature ventricular contraction) 07/13/2015  . SVT (supraventricular tachycardia) (HCC) 06/29/2015  . Tachycardia     Patient Active Problem List   Diagnosis Date Noted  . PAC (premature atrial contraction) 07/13/2015  . PVC (premature ventricular contraction) 07/13/2015  . History of PSVT (paroxysmal supraventricular tachycardia) 06/29/2015  . Essential hypertension 06/29/2015    Past Surgical History:  Procedure Laterality Date  . BREAST BIOPSY Left    benign     OB History     No obstetric history on file.      Home Medications    Prior to Admission medications   Medication Sig Start Date End Date Taking? Authorizing Provider  acetaminophen (TYLENOL) 500 MG tablet Take 2 tablets (1,000 mg total) by mouth every 6 (six) hours as needed. 05/18/18   Arby Barrette, MD  cephALEXin (KEFLEX) 500 MG capsule Take 2 capsules (1,000 mg total) by mouth 2 (two) times daily. 05/18/18   Arby Barrette, MD  hydrochlorothiazide (HYDRODIURIL) 25 MG tablet Take 1 tablet (25 mg total) by mouth daily. NEEDS AN OV FOR CONTINUATION OF REILLS 04/20/18   Chilton Si, MD  Multiple Vitamin (MULTIVITAMIN WITH MINERALS) TABS Take 1 tablet by mouth daily.    [provider]  nebivolol (BYSTOLIC) 2.5 MG tablet Take 1 tablet (2.5 mg total) by mouth daily. Needs appointment 04/08/18   Chilton Si, MD  ondansetron (ZOFRAN ODT) 4 MG disintegrating tablet Take 1 tablet (4 mg total) by mouth every 4 (four) hours as needed for nausea or vomiting. 05/18/18   Arby Barrette, MD    Family History History reviewed. No pertinent family history.  Social History Social History   Tobacco Use  . Smoking status: Never Smoker  . Smokeless tobacco: Never Used  Substance Use Topics  . Alcohol use: Not on file    Comment: social  . Drug use: Not on file     Allergies  Penicillins and Penicillins   Review of Systems Review of Systems 10 Systems reviewed and are negative for acute change except as noted in the HPI.   Physical Exam Updated Vital Signs BP 115/83   Pulse 84   Temp 98 F (36.7 C) (Oral)   Resp (!) 22   SpO2 97%   Physical Exam Constitutional:      Appearance: She is well-developed.  HENT:     Head: Normocephalic and atraumatic.     Right Ear: Tympanic membrane normal.     Left Ear: Tympanic membrane normal.     Nose: Nose normal.     Mouth/Throat:     Mouth: Mucous membranes are moist.     Comments: No exudates.  Tonsillar pillars  nonerythematous.  Mucous membranes pink and moist. Eyes:     Extraocular Movements: Extraocular movements intact.     Pupils: Pupils are equal, round, and reactive to light.  Neck:     Musculoskeletal: Neck supple.  Cardiovascular:     Rate and Rhythm: Regular rhythm. Tachycardia present.     Heart sounds: Normal heart sounds.     Comments: Borderline tachycardia.  Regular.  Distal pulses 2+ and symmetric. Pulmonary:     Effort: Pulmonary effort is normal.     Breath sounds: Normal breath sounds.     Comments: Intermittent harsh cough paroxysms.  Breath sounds are grossly clear with good airflow to the bases. Abdominal:     General: Bowel sounds are normal. There is no distension.     Palpations: Abdomen is soft.     Tenderness: There is no abdominal tenderness.  Musculoskeletal: Normal range of motion.        General: No swelling.     Right lower leg: No edema.     Left lower leg: No edema.  Skin:    General: Skin is warm and dry.  Neurological:     General: No focal deficit present.     Mental Status: She is alert and oriented to person, place, and time.     GCS: GCS eye subscore is 4. GCS verbal subscore is 5. GCS motor subscore is 6.     Coordination: Coordination normal.  Psychiatric:        Mood and Affect: Mood normal.      ED Treatments / Results  Labs (all labs ordered are listed, but only abnormal results are displayed) Labs Reviewed  BASIC METABOLIC PANEL - Abnormal; Notable for the following components:      Result Value   CO2 20 (*)    Glucose, Bld 112 (*)    GFR calc non Af Amer 59 (*)    All other components within normal limits  CBC - Abnormal; Notable for the following components:   WBC 11.4 (*)    RBC 5.35 (*)    Hemoglobin 15.1 (*)    HCT 46.6 (*)    All other components within normal limits  URINALYSIS, ROUTINE W REFLEX MICROSCOPIC - Abnormal; Notable for the following components:   Leukocytes, UA LARGE (*)    Bacteria, UA MANY (*)    All  other components within normal limits  URINE CULTURE  INFLUENZA PANEL BY PCR (TYPE A & B)  I-STAT TROPONIN, ED  I-STAT TROPONIN, ED    EKG EKG Interpretation  Date/Time:  Monday May 18 2018 13:47:33 EST Ventricular Rate:  120 PR Interval:  126 QRS Duration: 82 QT Interval:  328 QTC Calculation: 463 R Axis:   -18 Text  Interpretation:  Sinus tachycardia Low voltage QRS Cannot rule out Anterior infarct , age undetermined Abnormal ECG no acute appearing ischemic changes, no old comparison Confirmed by Arby BarrettePfeiffer, Blayde Bacigalupi [phone]([id number]) on 05/18/2018 3:04:37 PM   Radiology Dg Chest 2 View  Result Date: 05/18/2018 CLINICAL DATA:  Generalized fatigue with tachycardia. Cough and shortness of breath with sore throat and weakness. EXAM: CHEST - 2 VIEW COMPARISON:  None. FINDINGS: Lungs are adequately inflated without consolidation or effusion. Cardiomediastinal silhouette is within normal. Minimal degenerative change of the spine. IMPRESSION: No active cardiopulmonary disease. Electronically Signed   By: Elberta Fortisaniel  Boyle M.D.   On: 05/18/2018 14:22    Procedures Procedures (including critical care time)  Medications Ordered in ED Medications  sodium chloride 0.9 % bolus 1,000 mL (0 mLs Intravenous Stopped 05/18/18 1827)    Followed by  0.9 %  sodium chloride infusion (1,000 mLs Intravenous Not Given 05/18/18 1827)  sodium chloride flush (NS) 0.9 % injection 3 mL (3 mLs Intravenous Given 05/18/18 1602)     Initial Impression / Assessment and Plan / ED Course  I have reviewed the triage vital signs and the nursing notes.  Pertinent labs & imaging results that were available during my care of the patient were reviewed by me and considered in my medical decision making (see chart for details).    Patient symptoms are highly suspicious for influenza.  She however has tested negative for flu.  Patient does not have any respiratory distress or hypoxia, chest x-ray is clear.  At this time do not suspect  active pneumonia.  Patient reports that today she had an episode of her Fitbit documenting her heart rate up to 190.  She reports she has a long history of SVT and this felt the same.  She reports she was able to lie down and elevate her feet and do carotid massage with improvement.  Patient has been sick for almost a week with upper respiratory symptoms, nausea, body aches and fatigue.  She however has a urinalysis suggestive of UTI with large leukocytes and 21-50 WBCs with a good specimen.  When we discussed this, she reports she has been having some burning with urination but no back pain or abdominal pain.  Will opt to treat with positive specimen and dysuria.  Patient made aware that she should have her PCP follow-up on culture result.  Return precautions reviewed.  After hydration, patient reported feeling much improved.  Heart rate is normalized and she is clinically well in appearance.  Final Clinical Impressions(s) / ED Diagnoses   Final diagnoses:  Influenza-like illness  Acute cystitis without hematuria  Tachycardia  Dehydration    ED Discharge Orders         Ordered    cephALEXin (KEFLEX) 500 MG capsule  2 times daily     05/18/18 1827    acetaminophen (TYLENOL) 500 MG tablet  Every 6 hours PRN     05/18/18 1827    ondansetron (ZOFRAN ODT) 4 MG disintegrating tablet  Every 4 hours PRN     05/18/18 1827           Arby BarrettePfeiffer, Alese Furniss, MD 05/18/18 [phone]

## 2018-05-20 LAB — URINE CULTURE

## 2018-05-25 ENCOUNTER — Other Ambulatory Visit: Payer: Self-pay | Admitting: Cardiology

## 2018-05-29 ENCOUNTER — Ambulatory Visit: Payer: Medicare Other | Admitting: Cardiology

## 2018-05-29 ENCOUNTER — Encounter: Payer: Self-pay | Admitting: Cardiology

## 2018-05-29 VITALS — BP 122/78 | HR 72 | Ht 67.0 in | Wt 200.2 lb

## 2018-05-29 DIAGNOSIS — I10 Essential (primary) hypertension: Secondary | ICD-10-CM

## 2018-05-29 DIAGNOSIS — I493 Ventricular premature depolarization: Secondary | ICD-10-CM

## 2018-05-29 DIAGNOSIS — Z8679 Personal history of other diseases of the circulatory system: Secondary | ICD-10-CM | POA: Diagnosis not present

## 2018-05-29 NOTE — Progress Notes (Signed)
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  Her PSVT has been controlled with Bystolic, she has had fatigue with other beta blockers.  Her B/P is controlled with HCTZ.  She is here today for a check up.  She was in the ED 05/18/2018 with fatigue after an episode of tachycardia.  She had been ill for a week prior to this with URI and UTI symptoms.  On the day of admission she had 30 minutes of tachycardia and felt fatigued afterward.  In the ED she was in NSR.  Her labs suggested a UTI.  She was placed on ABs by her PCP as an OP and has done well since.  She admits when she drinks tea she gets palpitations that are short lived.  She has been under some stress at home.  She has taken in a niece with a 60 month old.  She watches the baby during the day.  She has not had time to keep up her walking program.      Current Outpatient Medications  Medication Sig Dispense Refill  . acetaminophen (TYLENOL) 500 MG tablet Take 2 tablets (1,000 mg total) by mouth every 6 (six) hours as needed. 30 tablet 0  . BIOTIN PO Take by mouth.    . Cholecalciferol (VITAMIN D3 PO) Take by mouth.    . hydrochlorothiazide (HYDRODIURIL) 25 MG tablet TAKE 1 TABLET (25 MG TOTAL) BY MOUTH DAILY. 30 tablet 0  . Multiple Vitamin (MULTIVITAMIN WITH MINERALS) TABS Take 1 tablet by mouth daily.    . nebivolol (BYSTOLIC) 2.5 MG tablet Take 1 tablet (2.5 mg total) by mouth daily. Needs appointment 60 tablet 0   No current facility-administered medications for this visit.     Allergies  Allergen Reactions  . Penicillins     Bruises under skin  . Penicillins Hives and Rash    Past Medical History:  Diagnosis Date  . Essential hypertension 06/29/2015  . Hypertension   . PAC (premature atrial contraction)  07/13/2015  . PVC (premature ventricular contraction) 07/13/2015  . SVT (supraventricular tachycardia) (HCC) 06/29/2015  . Tachycardia     Social History   Socioeconomic History  . Marital status: Divorced    Spouse name: Not on file  . Number of children: Not on file  . Years of education: Not on file  . Highest education level: Not on file  Occupational History  . Not on file  Social Needs  . Financial resource strain: Not on file  . Food insecurity:    Worry: Not on file    Inability: Not on file  . Transportation needs:    Medical: Not on file    Non-medical: Not on file  Tobacco Use  . Smoking status: Never Smoker  . Smokeless tobacco: Never Used  Substance and Sexual Activity  . Alcohol use: Not on file    Comment: social  . Drug use: Not on file  . Sexual activity: Not on file  Lifestyle  . Physical activity:    Days per week: Not on file    Minutes per session: Not on file  . Stress: Not on file  Relationships  . Social connections:    Talks on phone: Not on file    Gets together: Not  on file    Attends religious service: Not on file    Active member of club or organization: Not on file    Attends meetings of clubs or organizations: Not on file    Relationship status: Not on file  . Intimate partner violence:    Fear of current or ex partner: Not on file    Emotionally abused: Not on file    Physically abused: Not on file    Forced sexual activity: Not on file  Other Topics Concern  . Not on file  Social History Narrative   ** Merged History Encounter **         No family history on file.   Review of Systems: General: negative for chills, fever, night sweats or weight changes.  Cardiovascular: negative for chest pain, dyspnea on exertion, edema, orthopnea,  paroxysmal nocturnal dyspnea or shortness of breath Dermatological: negative for rash Respiratory: negative for cough or wheezing Urologic: negative for hematuria Abdominal: negative for  nausea, vomiting, diarrhea, bright red blood per rectum, melena, or hematemesis Neurologic: negative for visual changes, syncope, or dizziness All other systems reviewed and are otherwise negative except as noted above.    Blood pressure 122/78, pulse 72, height 5\' 7"  (1.702 m), weight 200 lb 3.2 oz (90.8 kg).  General appearance: alert, cooperative and no distress Neck: no carotid bruit and no JVD Lungs: clear to auscultation bilaterally Heart: regular rate and rhythm Extremities: no edema Skin: Skin color, texture, turgor normal. No rashes or lesions Neurologic: Grossly normal  EKG NSR-72, LAD  ASSESSMENT AND PLAN:   History of PSVT (paroxysmal supraventricular tachycardia) Recent episode in the setting of UTI and URI- self limited, none since.  Essential hypertension Controlled- 108/72 by me  PLAN  Same Rx f/u in one year. Avoid caffeine.  Resume daily walking if able.   Corine Shelter PA-C 05/29/2018 9:36 AM

## 2018-05-29 NOTE — Patient Instructions (Signed)
Medication Instructions:  Not needed If you need a refill on your cardiac medications before your next appointment, please call your pharmacy.   Lab work: Not needed If you have labs (blood work) drawn today and your tests are completely normal, you will receive your results only by: Marland Kitchen MyChart Message (if you have MyChart) OR . A paper copy in the mail If you have any lab test that is abnormal or we need to change your treatment, we will call you to review the results.  Testing/Procedures: Nit needed  Follow-Up: At [facility], you and your health needs are our priority.  As part of our continuing mission to provide you with exceptional heart care, we have created designated Provider Care Teams.  These Care Teams include your primary Cardiologist (physician) and Advanced Practice Providers (APPs -  Physician Assistants and Nurse Practitioners) who all work together to provide you with the care you need, when you need it. You will need a follow up appointment in 12 months.  Please call our office 2 months in advance to schedule this appointment.  You may see Chilton Si, MD or one of the following Advanced Practice Providers on your designated Care Team:   Corine Shelter, PA-C Judy Pimple, [place] . Marjie Skiff, PA-C  Any Other Special Instructions Will Be Listed Below (If Applicable).

## 2018-06-25 ENCOUNTER — Other Ambulatory Visit: Payer: Self-pay | Admitting: Cardiovascular Disease

## 2018-06-25 NOTE — Telephone Encounter (Signed)
Rx(s) sent to pharmacy electronically.  

## 2018-06-28 DEATH — deceased

## 2018-06-29 ENCOUNTER — Other Ambulatory Visit: Payer: Self-pay | Admitting: Cardiovascular Disease

## 2019-11-22 IMAGING — DX DG CHEST 2V
2 series · 2 of 2 positions shown · non-contrast
Comparison: None.

CLINICAL DATA: Generalized fatigue with tachycardia. Cough and
shortness of breath with sore throat and weakness.

EXAM:
CHEST - 2 VIEW

[chest pa]
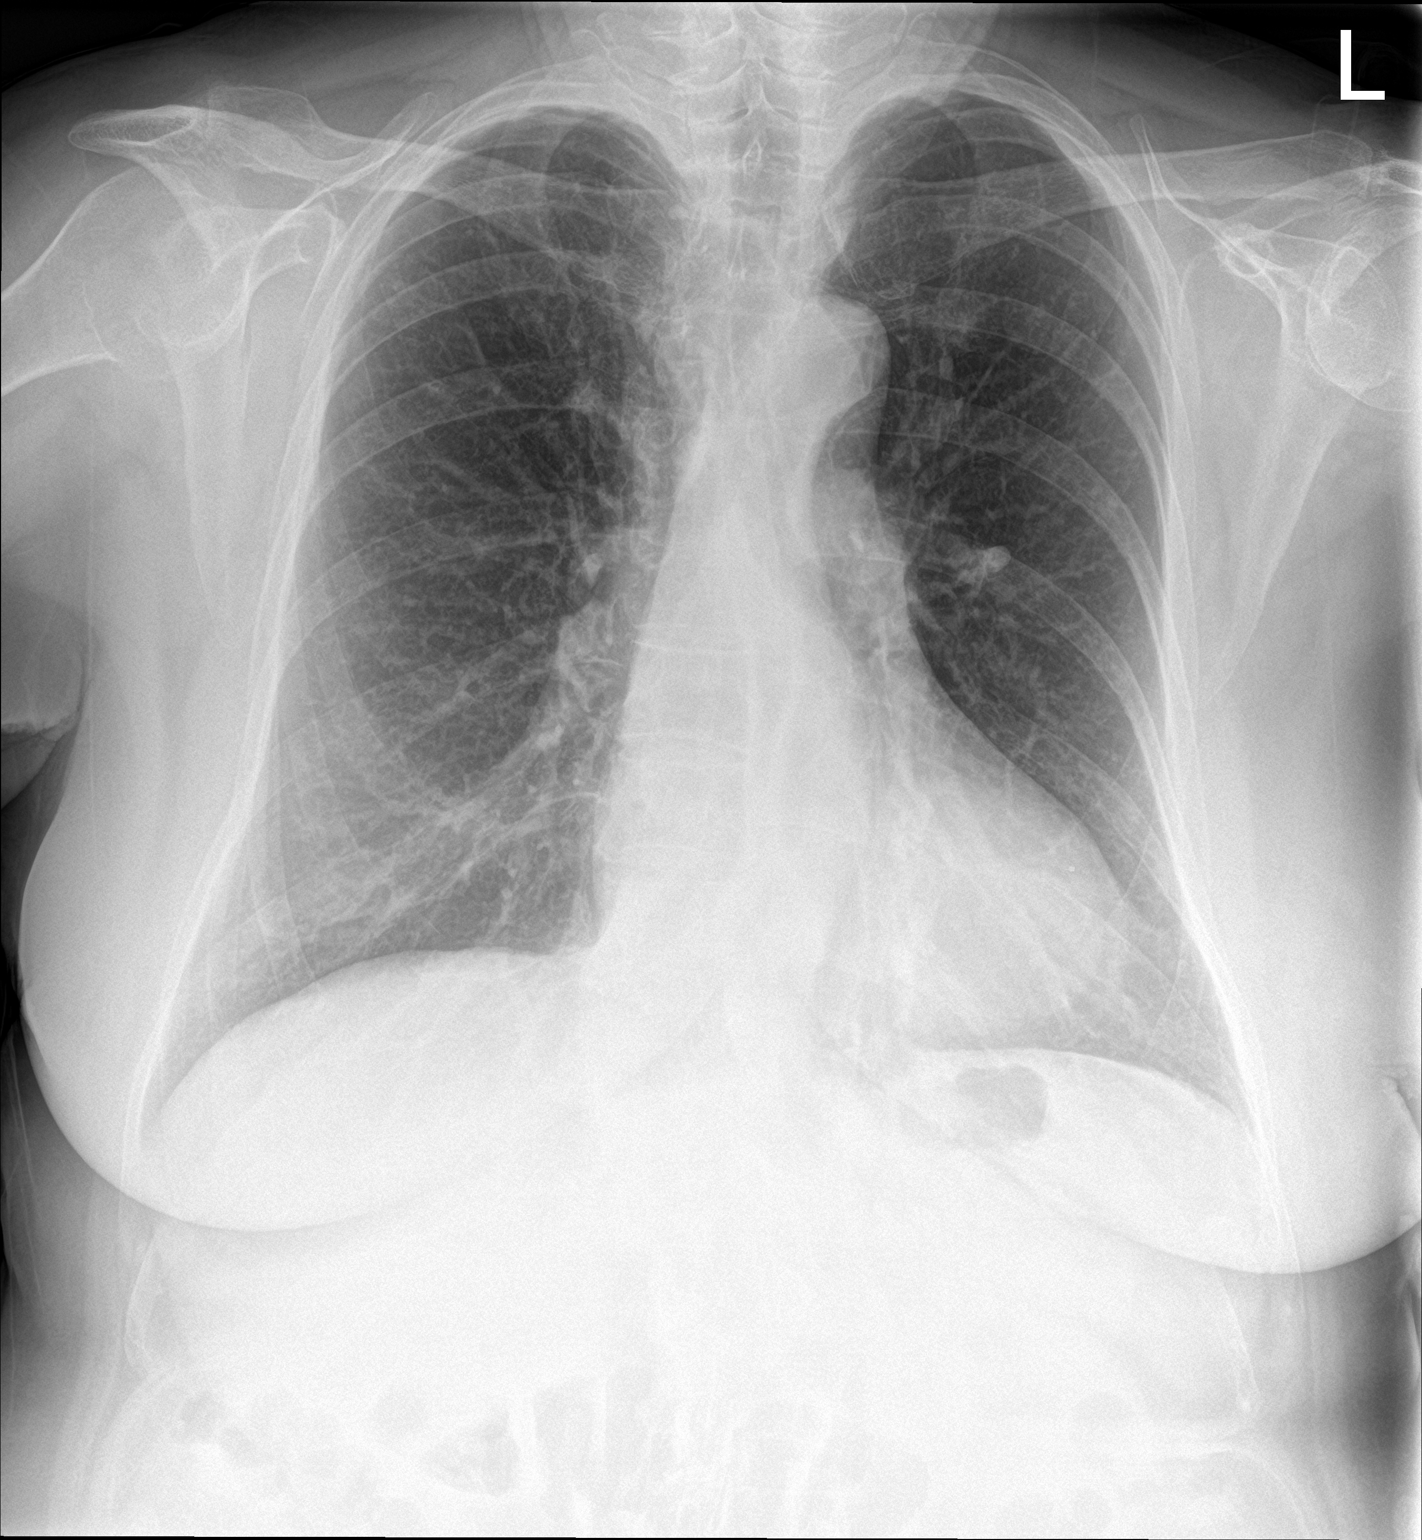

[chest lat]
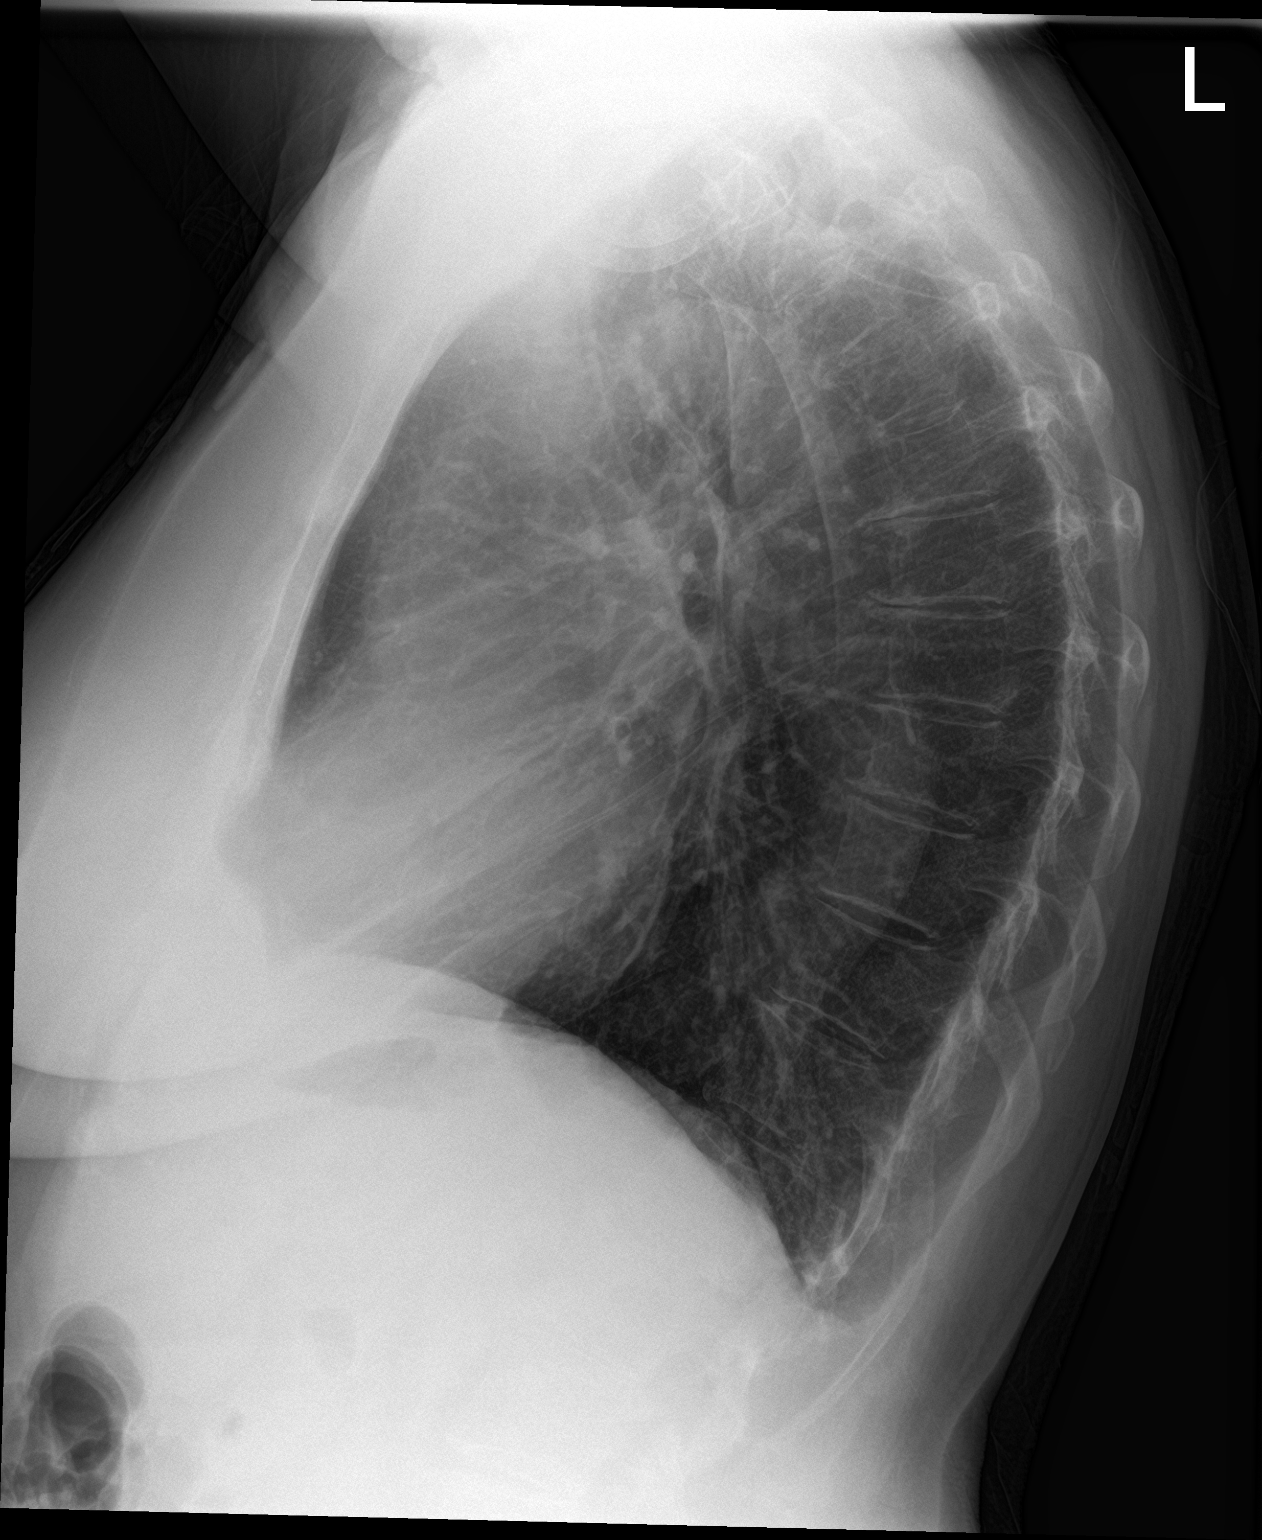

[2 of 2 positions shown; findings below may reference images not displayed]

FINDINGS: Lungs are adequately inflated without consolidation or effusion.
Cardiomediastinal silhouette is within normal. Minimal degenerative
change of the spine.
IMPRESSION: No active cardiopulmonary disease.
